# Patient Record
Sex: Male | Born: 1965 | Race: Black or African American | Hispanic: No | Marital: Single
Health system: Southern US, Community
[De-identification: ages and names within clinical notes are randomized; demographics above are authoritative.]

---

## 2009-04-24 ENCOUNTER — Inpatient Hospital Stay: Payer: Self-pay | Admitting: Internal Medicine

## 2012-04-08 ENCOUNTER — Emergency Department: Payer: Self-pay | Admitting: Unknown Physician Specialty

## 2012-08-18 ENCOUNTER — Emergency Department: Payer: Self-pay | Admitting: Emergency Medicine

## 2013-06-14 ENCOUNTER — Emergency Department: Payer: Self-pay | Admitting: Unknown Physician Specialty

## 2014-10-21 ENCOUNTER — Emergency Department: Payer: Self-pay | Admitting: Emergency Medicine

## 2017-08-12 ENCOUNTER — Emergency Department
Admission: EM | Admit: 2017-08-12 | Discharge: 2017-08-12 | Disposition: A | Discharge status: Home / Self Care | Payer: Self-pay | Attending: Emergency Medicine | Admitting: Emergency Medicine

## 2017-08-12 DIAGNOSIS — R1110 Vomiting, unspecified: Secondary | ICD-10-CM | POA: Insufficient documentation

## 2017-08-12 DIAGNOSIS — R1010 Upper abdominal pain, unspecified: Secondary | ICD-10-CM | POA: Insufficient documentation

## 2017-08-12 LAB — COMPREHENSIVE METABOLIC PANEL
ALBUMIN: 4.2 g/dL (ref 3.5–5.0)
ALT: 11 U/L — AB (ref 17–63)
AST: 18 U/L (ref 15–41)
Alkaline Phosphatase: 44 U/L (ref 38–126)
Anion gap: 6 (ref 5–15)
BUN: 15 mg/dL (ref 6–20)
CHLORIDE: 104 mmol/L (ref 101–111)
CO2: 26 mmol/L (ref 22–32)
CREATININE: 0.92 mg/dL (ref 0.61–1.24)
Calcium: 9.2 mg/dL (ref 8.9–10.3)
GFR calc non Af Amer: >60 mL/min (ref >60)
Glucose, Bld: 127 mg/dL — ABNORMAL HIGH (ref 65–99)
Potassium: 4 mmol/L (ref 3.5–5.1)
SODIUM: 136 mmol/L (ref 135–145)
Total Bilirubin: 1 mg/dL (ref 0.3–1.2)
Total Protein: 7.5 g/dL (ref 6.5–8.1)

## 2017-08-12 LAB — CBC
HCT: 40.7 % (ref 40.0–52.0)
Hemoglobin: 13.6 g/dL (ref 13.0–18.0)
MCH: 27.8 pg (ref 26.0–34.0)
MCHC: 33.5 g/dL (ref 32.0–36.0)
MCV: 83.1 fL (ref 80.0–100.0)
PLATELETS: 152 10*3/uL (ref 150–440)
RBC: 4.89 MIL/uL (ref 4.40–5.90)
RDW: 14 % (ref 11.5–14.5)
WBC: 4.4 10*3/uL (ref 3.8–10.6)

## 2017-08-12 LAB — URINALYSIS, COMPLETE (UACMP) WITH MICROSCOPIC
Bacteria, UA: NONE SEEN
Glucose, UA: NEGATIVE mg/dL
Hgb urine dipstick: NEGATIVE
KETONES UR: 5 mg/dL — AB
Nitrite: NEGATIVE
PROTEIN: 30 mg/dL — AB
Specific Gravity, Urine: 1.029 (ref 1.005–1.030)
pH: 6 (ref 5.0–8.0)

## 2017-08-12 LAB — TROPONIN I: Troponin I: <0.03 ng/mL (ref <0.03)

## 2017-08-12 LAB — LIPASE, BLOOD: LIPASE: 16 U/L (ref 11–51)

## 2017-08-12 MED ORDER — ONDANSETRON HCL 4 MG PO TABS: 4.0000 mg | ORAL_TABLET | Freq: Three times a day (TID) | ORAL | 0 refills | Status: AC | PRN

## 2017-08-12 MED ORDER — ONDANSETRON 4 MG PO TBDP
4.0000 mg | ORAL_TABLET | Freq: Once | ORAL | Status: AC
Start: 2017-08-12 — End: 2017-08-12
  Administered 2017-08-12: 4 mg via ORAL
  Filled 2017-08-12: qty 1

## 2017-08-12 NOTE — Discharge Instructions (Signed)
you were evaluated for nausea and vomiting, and although no certain cause was found, your exam and evaluation are overall reassuring in the emergency department today.  Return to the emergency department immediately for any worsening condition including chest pain, abdominal pain, trouble breathing, fever, black or bloody stool, or any other symptoms concerning to you.

## 2017-08-12 NOTE — ED Provider Notes (Addendum)
[facility] Emergency Department Provider Note ____________________________________________   I have reviewed the triage vital signs and the triage nursing note.  HISTORY  Chief Complaint Emesis   Historian Patient  HPI Jimmy Gordon is a 51 y.o. male Presents for vomiting since last night with nausea, nonbloody and nonbilious. Patient states he had severe upper abdominal cramping yesterday evening after chicken fast food. No black or bloody stools. No fever. Continued nausea overnight and a couple episodes of vomiting today. No diarrhea. He did have one normal bowel movement. Nausea is persistent and mild to moderate. He did miss work today.  Denies chest pain or trouble breathing.    History reviewed. No pertinent past medical history.  There are no active problems to display for this patient.   History reviewed. No pertinent surgical history.  Prior to Admission medications   Medication Sig Start Date End Date Taking? Authorizing Provider  ondansetron (ZOFRAN) 4 MG tablet Take 1 tablet (4 mg total) by mouth every 8 (eight) hours as needed for nausea or vomiting. 08/12/17   Governor Rooks, MD    No Known Allergies  No family history on file.  Social History Social History  Substance Use Topics  . Smoking status: Not on file  . Smokeless tobacco: Not on file  . Alcohol use Not on file    Review of Systems  Constitutional: Negative for fever. Eyes: Negative for visual changes. ENT: Negative for sore throat. Cardiovascular: Negative for chest pain. Respiratory: Negative for shortness of breath. Gastrointestinal: Negative for diarrhea. Genitourinary: Negative for dysuria. Musculoskeletal: Negative for back pain. Skin: Negative for rash. Neurological: Negative for headache.  ____________________________________________   PHYSICAL EXAM:  VITAL SIGNS: ED Triage Vitals  Enc Vitals Group     BP 08/12/17 1625 113/78     Pulse Rate  08/12/17 1625 (!) 104     Resp 08/12/17 1625 16     Temp 08/12/17 1625 99.3 F (37.4 C)     Temp Source 08/12/17 1625 Oral     SpO2 08/12/17 1625 98 %     Weight 08/12/17 1624 150 lb (68 kg)     Height 08/12/17 1624  (1.753 m)     Head Circumference --      Peak Flow --      Pain Score 08/12/17 1842 3     Pain Loc --      Pain Edu? --      Excl. in GC? --      Constitutional: Alert and oriented. Well appearing and in no distress. HEENT   Head: Normocephalic and atraumatic.      Eyes: Conjunctivae are normal. Pupils equal and round.       Ears:         Nose: No congestion/rhinnorhea.   Mouth/Throat: Mucous membranes are moist.   Neck: No stridor. Cardiovascular/Chest: Normal rate, regular rhythm.  No murmurs, rubs, or gallops. Respiratory: Normal respiratory effort without tachypnea nor retractions. Breath sounds are clear and equal bilaterally. No wheezes/rales/rhonchi. Gastrointestinal: Soft. No distention, no guarding, no rebound. Nontender to superficial and deep palpation in 4 quadrants.  Genitourinary/rectal:Deferred Musculoskeletal: Nontender with normal range of motion in all extremities. No joint effusions.  No lower extremity tenderness.  No edema. Neurologic:  Normal speech and language. No gross or focal neurologic deficits are appreciated. Skin:  Skin is warm, dry and intact. No rash noted. Psychiatric: Mood and affect are normal. Speech and behavior are normal. Patient exhibits appropriate insight and  judgment.   ____________________________________________  LABS (pertinent positives/negatives)  Labs Reviewed  COMPREHENSIVE METABOLIC PANEL - Abnormal; Notable for the following:       Result Value   Glucose, Bld 127 (*)    ALT 11 (*)    All other components within normal limits  URINALYSIS, COMPLETE (UACMP) WITH MICROSCOPIC - Abnormal; Notable for the following:    Color, Urine AMBER (*)    APPearance HAZY (*)    Bilirubin Urine SMALL (*)     Ketones, ur 5 (*)    Protein, ur 30 (*)    Leukocytes, UA MODERATE (*)    Squamous Epithelial / LPF 6-30 (*)    All other components within normal limits  URINE CULTURE  LIPASE, BLOOD  CBC  TROPONIN I    ____________________________________________    EKG I, Governor Rooksebecca Shantaya Bluestone, MD, the attending physician have personally viewed and interpreted all ECGs.  65 bpm. Normal sinus rhythm. Incomplete right bundle branch block. Normal axis. Wavy underlying interference baseline. Nonspecific ST and T-wave. ____________________________________________  RADIOLOGY All Xrays were viewed by me. Imaging interpreted by Radiologist.  none __________________________________________  PROCEDURES  Procedure(s) performed: None  Critical Care performed: None  ____________________________________________   ED COURSE / ASSESSMENT AND PLAN  Pertinent labs & imaging results that were available during my care of the patient were reviewed by me and considered in my medical decision making (see chart for details).   patient documented to have low-grade temperature and initial tachycardia, but on exam heart rate was in the 70s. No hypotension. Patient is overall well-appearing and able to take by mouth after Zofran here. Symptoms seem most consistent with upper GI virus. Seems unlikely related to cardiac etiology. EKG is nonspecific, I did add on troponin.  No abdominal pain on palpation, less likely suspect intra-abdominal emergency medical condition.  I think patient is okay for outpatient follow-up given his symptoms are basically resolved now.  Bleeding the added on troponin. Patient care transferred to Dr. Derrill KayGoodman at shift change at 8:30 PM. If troponin negative, may be discharged with my prepared Discharge instructions.     CONSULTATIONS:   None   Patient / Family / Caregiver informed of clinical course, medical decision-making process, and agree with plan.   I discussed return  precautions, follow-up instructions, and discharge instructions with patient and/or family.  Discharge Instructions :  you were evaluated for nausea and vomiting, and although no certain cause was found, your exam and evaluation are overall reassuring in the emergency department today.  Return to the emergency department immediately for any worsening condition including chest pain, abdominal pain, trouble breathing, fever, black or bloody stool, or any other symptoms concerning to you.  ___________________________________________   FINAL CLINICAL IMPRESSION(S) / ED DIAGNOSES   Final diagnoses:  Non-intractable vomiting, presence of nausea not specified, unspecified vomiting type              Note: This dictation was prepared with Dragon dictation. Any transcriptional errors that result from this process are unintentional    Governor RooksLord, Jimmy Schubring, MD 08/12/17 2032    Governor RooksLord, Jimmy Bar, MD 08/12/17 2033

## 2017-08-12 NOTE — ED Notes (Signed)
Patient given telephone to call work.

## 2017-08-12 NOTE — ED Notes (Signed)
Patient given ginger ale and ED sandwich tray per Dr. Shaune PollackLord. Patient is tolerating the meal well.

## 2017-08-12 NOTE — ED Triage Notes (Signed)
Pt came to ED via pov c/o abdominal pain and emesis starting last night. Reports vomited x6 times today.

## 2017-08-14 LAB — URINE CULTURE: CULTURE: NO GROWTH

## 2018-07-25 ENCOUNTER — Emergency Department: Payer: No Typology Code available for payment source

## 2018-07-25 ENCOUNTER — Emergency Department
Admission: EM | Admit: 2018-07-25 | Discharge: 2018-07-25 | Disposition: A | Discharge status: Home / Self Care | Payer: No Typology Code available for payment source | Attending: Emergency Medicine | Admitting: Emergency Medicine

## 2018-07-25 ENCOUNTER — Other Ambulatory Visit: Payer: Self-pay

## 2018-07-25 DIAGNOSIS — S39012A Strain of muscle, fascia and tendon of lower back, initial encounter: Secondary | ICD-10-CM

## 2018-07-25 DIAGNOSIS — Y9389 Activity, other specified: Secondary | ICD-10-CM | POA: Diagnosis not present

## 2018-07-25 DIAGNOSIS — S0990XA Unspecified injury of head, initial encounter: Secondary | ICD-10-CM | POA: Diagnosis present

## 2018-07-25 DIAGNOSIS — Y92410 Unspecified street and highway as the place of occurrence of the external cause: Secondary | ICD-10-CM | POA: Diagnosis not present

## 2018-07-25 DIAGNOSIS — Y999 Unspecified external cause status: Secondary | ICD-10-CM | POA: Diagnosis not present

## 2018-07-25 DIAGNOSIS — G44319 Acute post-traumatic headache, not intractable: Secondary | ICD-10-CM | POA: Diagnosis not present

## 2018-07-25 DIAGNOSIS — S161XXA Strain of muscle, fascia and tendon at neck level, initial encounter: Secondary | ICD-10-CM

## 2018-07-25 MED ORDER — METHOCARBAMOL 500 MG PO TABS: 500.0000 mg | ORAL_TABLET | Freq: Four times a day (QID) | ORAL | 0 refills | Status: AC

## 2018-07-25 MED ORDER — LIDOCAINE HCL (PF) 1 % IJ SOLN: 0.9000 mL | Freq: Once | INTRAMUSCULAR | Status: DC

## 2018-07-25 MED ORDER — MELOXICAM 15 MG PO TABS: 15.0000 mg | ORAL_TABLET | Freq: Every day | ORAL | 0 refills | Status: DC

## 2018-07-25 MED ORDER — METHOCARBAMOL 500 MG PO TABS
1000.0000 mg | ORAL_TABLET | Freq: Once | ORAL | Status: AC
  Administered 2018-07-25: 1000 mg via ORAL
  Filled 2018-07-25: qty 2

## 2018-07-25 MED ORDER — MELOXICAM 7.5 MG PO TABS
15.0000 mg | ORAL_TABLET | Freq: Once | ORAL | Status: AC
  Administered 2018-07-25: 15 mg via ORAL
  Filled 2018-07-25: qty 2

## 2018-07-25 MED ORDER — AZITHROMYCIN 500 MG PO TABS: 1000.0000 mg | ORAL_TABLET | Freq: Once | ORAL | Status: DC

## 2018-07-25 MED ORDER — CEFTRIAXONE SODIUM 250 MG IJ SOLR: 250.0000 mg | Freq: Once | INTRAMUSCULAR | Status: DC
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
## 2019-08-09 ENCOUNTER — Emergency Department: Payer: Self-pay

## 2019-08-09 ENCOUNTER — Emergency Department
Admission: EM | Admit: 2019-08-09 | Discharge: 2019-08-09 | Disposition: A | Discharge status: Home / Self Care | Payer: Self-pay | Attending: Emergency Medicine | Admitting: Emergency Medicine

## 2019-08-09 ENCOUNTER — Encounter: Payer: Self-pay | Admitting: Emergency Medicine

## 2019-08-09 ENCOUNTER — Other Ambulatory Visit: Payer: Self-pay

## 2019-08-09 DIAGNOSIS — R0789 Other chest pain: Secondary | ICD-10-CM | POA: Insufficient documentation

## 2019-08-09 DIAGNOSIS — M79672 Pain in left foot: Secondary | ICD-10-CM | POA: Insufficient documentation

## 2019-08-09 DIAGNOSIS — M25511 Pain in right shoulder: Secondary | ICD-10-CM | POA: Insufficient documentation

## 2019-08-09 DIAGNOSIS — Z79899 Other long term (current) drug therapy: Secondary | ICD-10-CM | POA: Insufficient documentation

## 2019-08-09 LAB — HEPATIC FUNCTION PANEL
ALT: 13 U/L (ref 0–44)
AST: 20 U/L (ref 15–41)
Albumin: 4 g/dL (ref 3.5–5.0)
Alkaline Phosphatase: 46 U/L (ref 38–126)
Bilirubin, Direct: 0.2 mg/dL (ref 0.0–0.2)
Indirect Bilirubin: 0.6 mg/dL (ref 0.3–0.9)
Total Bilirubin: 0.8 mg/dL (ref 0.3–1.2)
Total Protein: 7.2 g/dL (ref 6.5–8.1)

## 2019-08-09 LAB — CBC
HCT: 40.3 % (ref 39.0–52.0)
Hemoglobin: 13 g/dL (ref 13.0–17.0)
MCH: 26.8 pg (ref 26.0–34.0)
MCHC: 32.3 g/dL (ref 30.0–36.0)
MCV: 83.1 fL (ref 80.0–100.0)
Platelets: 188 10*3/uL (ref 150–400)
RBC: 4.85 MIL/uL (ref 4.22–5.81)
RDW: 14.3 % (ref 11.5–15.5)
WBC: 4.8 10*3/uL (ref 4.0–10.5)
nRBC: 0 % (ref 0.0–0.2)

## 2019-08-09 LAB — BASIC METABOLIC PANEL
Anion gap: 7 (ref 5–15)
BUN: 14 mg/dL (ref 6–20)
CO2: 27 mmol/L (ref 22–32)
Calcium: 9 mg/dL (ref 8.9–10.3)
Chloride: 104 mmol/L (ref 98–111)
Creatinine, Ser: 0.96 mg/dL (ref 0.61–1.24)
GFR calc Af Amer: >60 mL/min (ref >60)
GFR calc non Af Amer: >60 mL/min (ref >60)
Glucose, Bld: 88 mg/dL (ref 70–99)
Potassium: 4.3 mmol/L (ref 3.5–5.1)
Sodium: 138 mmol/L (ref 135–145)

## 2019-08-09 LAB — TROPONIN I (HIGH SENSITIVITY): Troponin I (High Sensitivity): 4 ng/L (ref <18)

## 2019-08-09 LAB — LIPASE, BLOOD: Lipase: 24 U/L (ref 11–51)

## 2019-08-09 MED ORDER — ACETAMINOPHEN 500 MG PO TABS
1000.0000 mg | ORAL_TABLET | Freq: Once | ORAL | Status: AC
  Administered 2019-08-09: 09:00:00 1000 mg via ORAL
  Filled 2019-08-09: qty 2

## 2019-08-09 MED ORDER — NAPROXEN 500 MG PO TABS: 500.0000 mg | ORAL_TABLET | Freq: Two times a day (BID) | ORAL | 0 refills | Status: AC

## 2019-08-09 MED ORDER — KETOROLAC TROMETHAMINE 30 MG/ML IJ SOLN
30.0000 mg | Freq: Once | INTRAMUSCULAR | Status: AC
  Administered 2019-08-09: 10:00:00 30 mg via INTRAVENOUS
  Filled 2019-08-09: qty 1

## 2019-08-09 NOTE — ED Provider Notes (Signed)
[facility] Emergency Department Provider Note  ____________________________________________   First MD Initiated Contact with Patient 08/09/19 [phone]     (approximate)  I have reviewed the triage vital signs and the nursing notes.   HISTORY  Chief Complaint Chest Pain    HPI PHU BLANKENBURG is a 53 y.o. male who presents with R sided chest pain.  On further discussion with patient the pain seems to be more in his right shoulder than in his chest.  The pain is mild, 3 out of 10 currently, radiates from the front of the shoulder into the back of the shoulder, intermittent, occasionally worse with certain movements, has not been taking anything to help at home.  Denies any heavy lifting or doing anything to strain the shoulder.  The pain has been there intermittently for the past 7 days.  Patient also reports pain in his left foot on the lateral side.  Denies any trauma.  Patient says this pain is been there for months. He denies sob, cough, fevers.  Denies prior heart history.     History reviewed. No pertinent past medical history.  There are no active problems to display for this patient.   History reviewed. No pertinent surgical history.  Prior to Admission medications   Medication Sig Start Date End Date Taking? Authorizing Provider  meloxicam (MOBIC) 15 MG tablet Take 1 tablet (15 mg total) by mouth daily. 07/25/18   Cuthriell, Delorise Royals, PA-C  methocarbamol (ROBAXIN) 500 MG tablet Take 1 tablet (500 mg total) by mouth 4 (four) times daily. 07/25/18   Cuthriell, Delorise Royals, PA-C  ondansetron (ZOFRAN) 4 MG tablet Take 1 tablet (4 mg total) by mouth every 8 (eight) hours as needed for nausea or vomiting. 08/12/17   Governor Rooks, MD    Allergies Patient has no known allergies.  No family history on file.  Social History Social History   Tobacco Use  . Smoking status: Not on file  Substance Use Topics  . Alcohol use: Not on file  . Drug use: Not on  file      Review of Systems Constitutional: No fever/chills Eyes: No visual changes. ENT: No sore throat. Cardiovascular: No chest pain  Respiratory: Denies shortness of breath. Gastrointestinal: No abdominal pain.  No nausea, no vomiting.  No diarrhea.  No constipation. Genitourinary: Negative for dysuria. Musculoskeletal: Negative for back pain.  Positive left foot pain, + shoulder pain  Skin: Negative for rash. Neurological: Negative for headaches, focal weakness or numbness. All other ROS negative ____________________________________________   PHYSICAL EXAM:  VITAL SIGNS: ED Triage Vitals  Enc Vitals Group     BP 08/09/19 0849 110/86     Pulse Rate 08/09/19 0849 79     Resp 08/09/19 0849 16     Temp 08/09/19 0849 97.6 F (36.4 C)     Temp Source 08/09/19 0849 Oral     SpO2 08/09/19 0849 98 %     Weight 08/09/19 0846 154 lb (69.9 kg)     Height 08/09/19 0846 5\' 9"  (1.753 m)     Head Circumference --      Peak Flow --      Pain Score 08/09/19 0846 7     Pain Loc --      Pain Edu? --      Excl. in GC? --     Constitutional: Alert and oriented. Well appearing and in no acute distress. Eyes: Conjunctivae are normal. EOMI. Head: Atraumatic. Nose: No congestion/rhinnorhea.  Mouth/Throat: Mucous membranes are moist.   Neck: No stridor. Trachea Midline. FROM Cardiovascular: Normal rate, regular rhythm. Grossly normal heart sounds.  Good peripheral circulation.  DP pulses 2+ bilaterally.  Radial pulses 2+ bilaterally Respiratory: Normal respiratory effort.  No retractions. Lungs CTAB. Gastrointestinal: Soft and nontender. No distention. No abdominal bruits.  Musculoskeletal: Tenderness on the left lateral malleolus.  No warmth or erythema over the joint able to dorsiflex and plantarflex. Thickened toe nails. No wounds noted.  2+ distal pulses.  Reproducing of some pain in the right shoulder with rotation.  No warmth or erythema over the joint.  Neurologic:  Normal speech  and language. No gross focal neurologic deficits are appreciated.  Skin:  Skin is warm, dry and intact. No rash noted. Psychiatric: Mood and affect are normal. Speech and behavior are normal. GU: Deferred   ____________________________________________   LABS (all labs ordered are listed, but only abnormal results are displayed)  Labs Reviewed  BASIC METABOLIC PANEL  CBC  HEPATIC FUNCTION PANEL  LIPASE, BLOOD  TROPONIN I (HIGH SENSITIVITY)  TROPONIN I (HIGH SENSITIVITY)   ____________________________________________   ED ECG REPORT I, Vanessa [place], the attending physician, personally viewed and interpreted this ECG.  EKG is sinus rate of 77 with sinus arrhythmia, atrial enlargement and evidence of LVH, QTC is 466 no ST elevation, no T wave inversion ____________________________________________  RADIOLOGY Robert Bellow, personally viewed and evaluated these images (plain radiographs) as part of my medical decision making, as well as reviewing the written report by the radiologist.  ED MD interpretation: Chest x-ray that evidence of pneumonia.  No fractures noted on the other x-rays  Official radiology report(s): Dg Chest 2 View  Result Date: 08/09/2019 CLINICAL DATA:  53 year old male with history of right-sided chest and shoulder pain for the past 2 months. EXAM: CHEST - 2 VIEW COMPARISON:  Chest x-ray 04/23/2009. FINDINGS: Lung volumes are normal. No consolidative airspace disease. No pleural effusions. No pneumothorax. No pulmonary nodule or mass noted. Pulmonary vasculature and the cardiomediastinal silhouette are within normal limits. IMPRESSION: No radiographic evidence of acute cardiopulmonary disease. Electronically Signed   By: Vinnie Langton M.D.   On: 08/09/2019 10:03   Dg Shoulder Right  Result Date: 08/09/2019 CLINICAL DATA:  Right shoulder pain, no known injury, initial encounter EXAM: RIGHT SHOULDER - 2+ VIEW COMPARISON:  None. FINDINGS: There is no evidence of  fracture or dislocation. There is no evidence of arthropathy or other focal bone abnormality. Soft tissues are unremarkable. IMPRESSION: No acute abnormality noted. Electronically Signed   By: Inez Catalina M.D.   On: 08/09/2019 10:01   Dg Ankle 2 Views Left  Result Date: 08/09/2019 CLINICAL DATA:  Left ankle pain, no known injury, initial encounter EXAM: LEFT ANKLE - 2 VIEW COMPARISON:  None. FINDINGS: There is no evidence of fracture, dislocation, or joint effusion. There is no evidence of arthropathy or other focal bone abnormality. Soft tissues are unremarkable. IMPRESSION: No acute abnormality noted. Electronically Signed   By: Inez Catalina M.D.   On: 08/09/2019 10:00    ____________________________________________   PROCEDURES  Procedure(s) performed (including Critical Care):  Procedures   ____________________________________________   INITIAL IMPRESSION / ASSESSMENT AND PLAN / ED COURSE   ALDEN BENSINGER was evaluated in Emergency Department on 08/09/2019 for the symptoms described in the history of present illness. He was evaluated in the context of the global COVID-19 pandemic, which necessitated consideration that the patient might be at risk for infection with  the SARS-CoV-2 virus that causes COVID-19. Institutional protocols and algorithms that pertain to the evaluation of patients at risk for COVID-19 are in a state of rapid change based on information released by regulatory bodies including the CDC and federal and state organizations. These policies and algorithms were followed during the patient's care in the ED.    Most Likely DDx:  -Given pain is more so situated on the right shoulder than in his chest is most likely musculoskeletal in nature.  Patient says it is also worsened with certain movements.  However given patient's age risk factors will get cardiac markers to evaluate for ACS.  Given pain >3 hours can rule out with one trop if negative. Patient also noted to have left  ankle tenderness.  No redness or swelling of the ankle to suggest septic joint.  Full range of motion of joint although there is tenderness of the lateral malleolus.  Will get an x-ray to further evaluate but given this pain is been going on for 2 months most likely musculoskeletal. Pt also ambulated into the room without difficulty.   DDx that was also considered d/t potential to cause harm, but was found less likely based on history and physical (as detailed above): -PNA (no fevers, cough but CXR to evaluate) -PNX (reassured with equal b/l breath sounds, CXR to evaluate) -Symptomatic anemia (will get H&H) -Pulmonary embolism as no sob at rest, not pleuritic in nature, no hypoxia -Aortic Dissection as no tearing pain and no radiation to the mid back, pulses equal -Pericarditis no rub on exam, EKG changes or hx to suggest dx -Tamponade (no notable SOB, tachycardic, hypotensive) -Esophageal rupture (no h/o diffuse vomitting/no crepitus)   Labs are reassuring with no evidence of white count making infection less likely.  Hemoglobin is at baseline.  Cardiac marker is 4 given this pain is been going on for the past few days rules out for ACS.  X-rays are negative.  10:19 AM updated patient on results.  Discussed naproxen and Tylenol for symptom management.  Will give patient a podiatry follow-up for his thickened nails. Provided ace bandage.   I discussed the provisional nature of ED diagnosis, the treatment so far, the ongoing plan of care, follow up appointments and return precautions with the patient and any family or support people present. They expressed understanding and agreed with the plan, discharged home.    ____________________________________________   FINAL CLINICAL IMPRESSION(S) / ED DIAGNOSES   Final diagnoses:  Acute pain of right shoulder  Foot pain, left     MEDICATIONS GIVEN DURING THIS VISIT:  Medications  ketorolac (TORADOL) 30 MG/ML injection 30 mg (has no  administration in time range)  acetaminophen (TYLENOL) tablet 1,000 mg (1,000 mg Oral Given 08/09/19 0904)     ED Discharge Orders         Ordered    naproxen (NAPROSYN) 500 MG tablet  2 times daily with meals     08/09/19 1026           Note:  This document was prepared using Dragon voice recognition software and may include unintentional dictation errors.   Concha SeFunke, Freda Jaquith E, MD 08/09/19 1027

## 2019-08-09 NOTE — ED Notes (Signed)
This RN discussed with Dr. Jari Pigg regarding patient request for crutches prior to discharge, initially upon arrival to room pt ambulatory without difficulty or limp. Per Dr. Jari Pigg pt does not need crutches if ambulatory without difficulty. Reported to this RN by Chrissy RN that patient was angry regarding not being given crutches. Chrissy, RN reported that she explained to patient that he did not need crutches per MD and given that pain was ongoing x 2 months. Pt visualized limping with limp not previously visualized by this RN down the hall to discharge.

## 2019-08-09 NOTE — Discharge Instructions (Addendum)
Your work-up was reassuring including negative x-rays and normal labs.  Take tylenol 1g three times daily and naproxen to help with pain.   I have given you podiatry's number to follow-up with.  You should also check with your insurance and find a primary care doctor to make an appointment.  Return to the ER for chest pain, shortness of breath, fevers or any other concerns

## 2019-08-09 NOTE — ED Notes (Signed)
Patient transported to X-ray 

## 2019-08-09 NOTE — ED Triage Notes (Signed)
Pt arrives with complaints of right sided chest tightness that radiates to his back. Pt reports the pain started 5 days prior and is worse with movement.

## 2019-08-09 NOTE — ED Notes (Signed)
Cap refill <3 seconds before application. Ace bandaged applied to right ankle. Cap refill <3 seconds after application. Educated pt on applying ace wrap at home.

## 2020-06-11 ENCOUNTER — Ambulatory Visit: Payer: No Typology Code available for payment source | Attending: Internal Medicine

## 2020-07-20 ENCOUNTER — Ambulatory Visit: Payer: No Typology Code available for payment source | Attending: Internal Medicine

## 2020-07-20 DIAGNOSIS — Z23 Encounter for immunization: Secondary | ICD-10-CM

## 2020-07-20 NOTE — Progress Notes (Signed)
   Covid-19 Vaccination Clinic  Name:  Jimmy Gordon    MRN: [id number] DOB: 11/11/1966  07/20/2020  Mr. [name] was observed post Covid-19 immunization for 15 minutes without incident. He was provided with Vaccine Information Sheet and instruction to access the V-Safe system.   Mr. [name] was instructed to call 911 with any severe reactions post vaccine: Marland Kitchen Difficulty breathing  . Swelling of face and throat  . A fast heartbeat  . A bad rash all over body  . Dizziness and weakness   Immunizations Administered    Name Date Dose VIS Date Route   Moderna COVID-19 Vaccine 07/20/2020  4:01 PM 0.5 mL 11/2019 Intramuscular   Manufacturer: Moderna   Lot: [id number]   NDC: 38887-579-72

## 2020-08-17 ENCOUNTER — Ambulatory Visit: Payer: Self-pay

## 2021-04-28 IMAGING — CR DG CHEST 2V
2 series · 2 of 2 positions shown · non-contrast
Comparison: Chest x-ray 04/23/2009.

CLINICAL DATA: 52-year-old male with history of right-sided chest
and shoulder pain for the past 2 months.

EXAM:
CHEST - 2 VIEW

[chest pa]
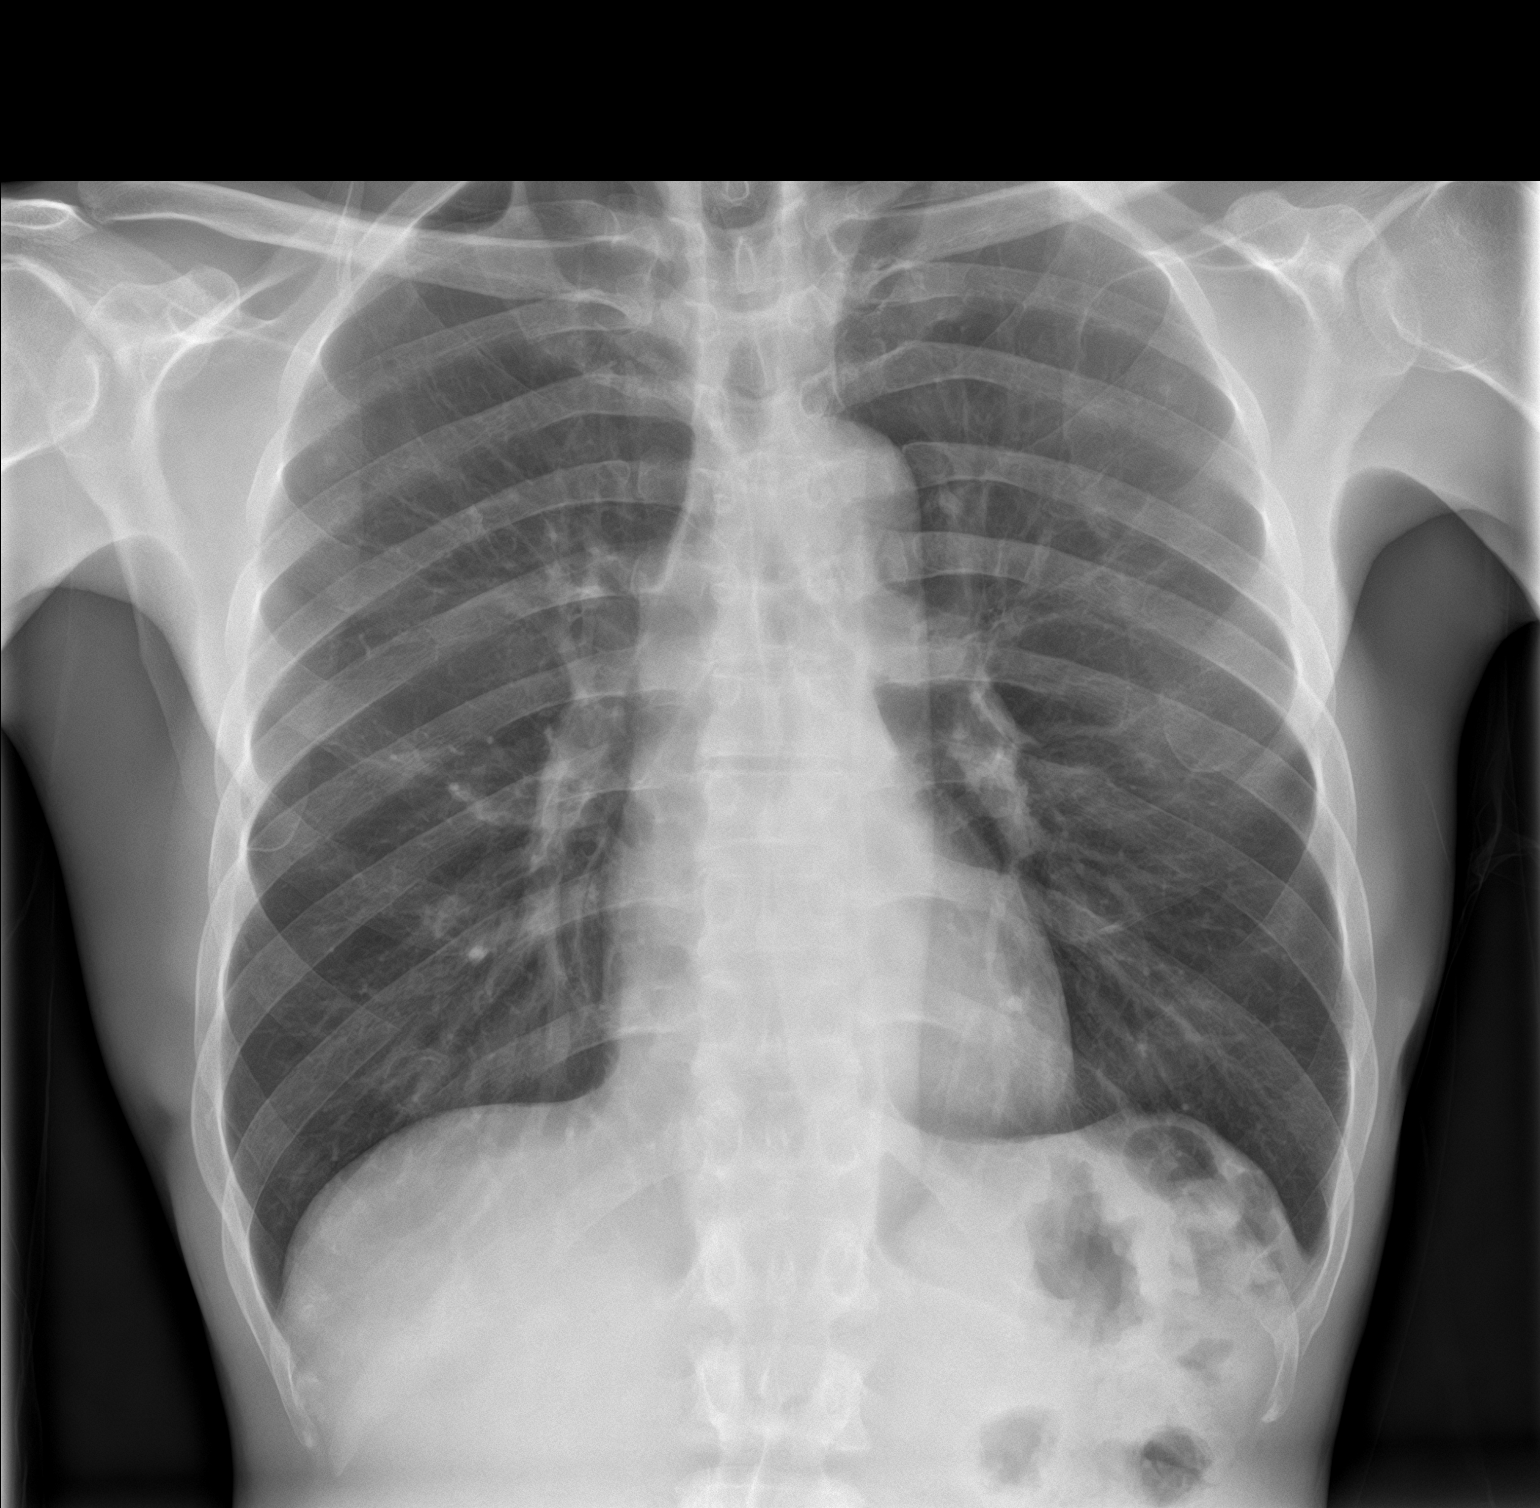

[chest lat]
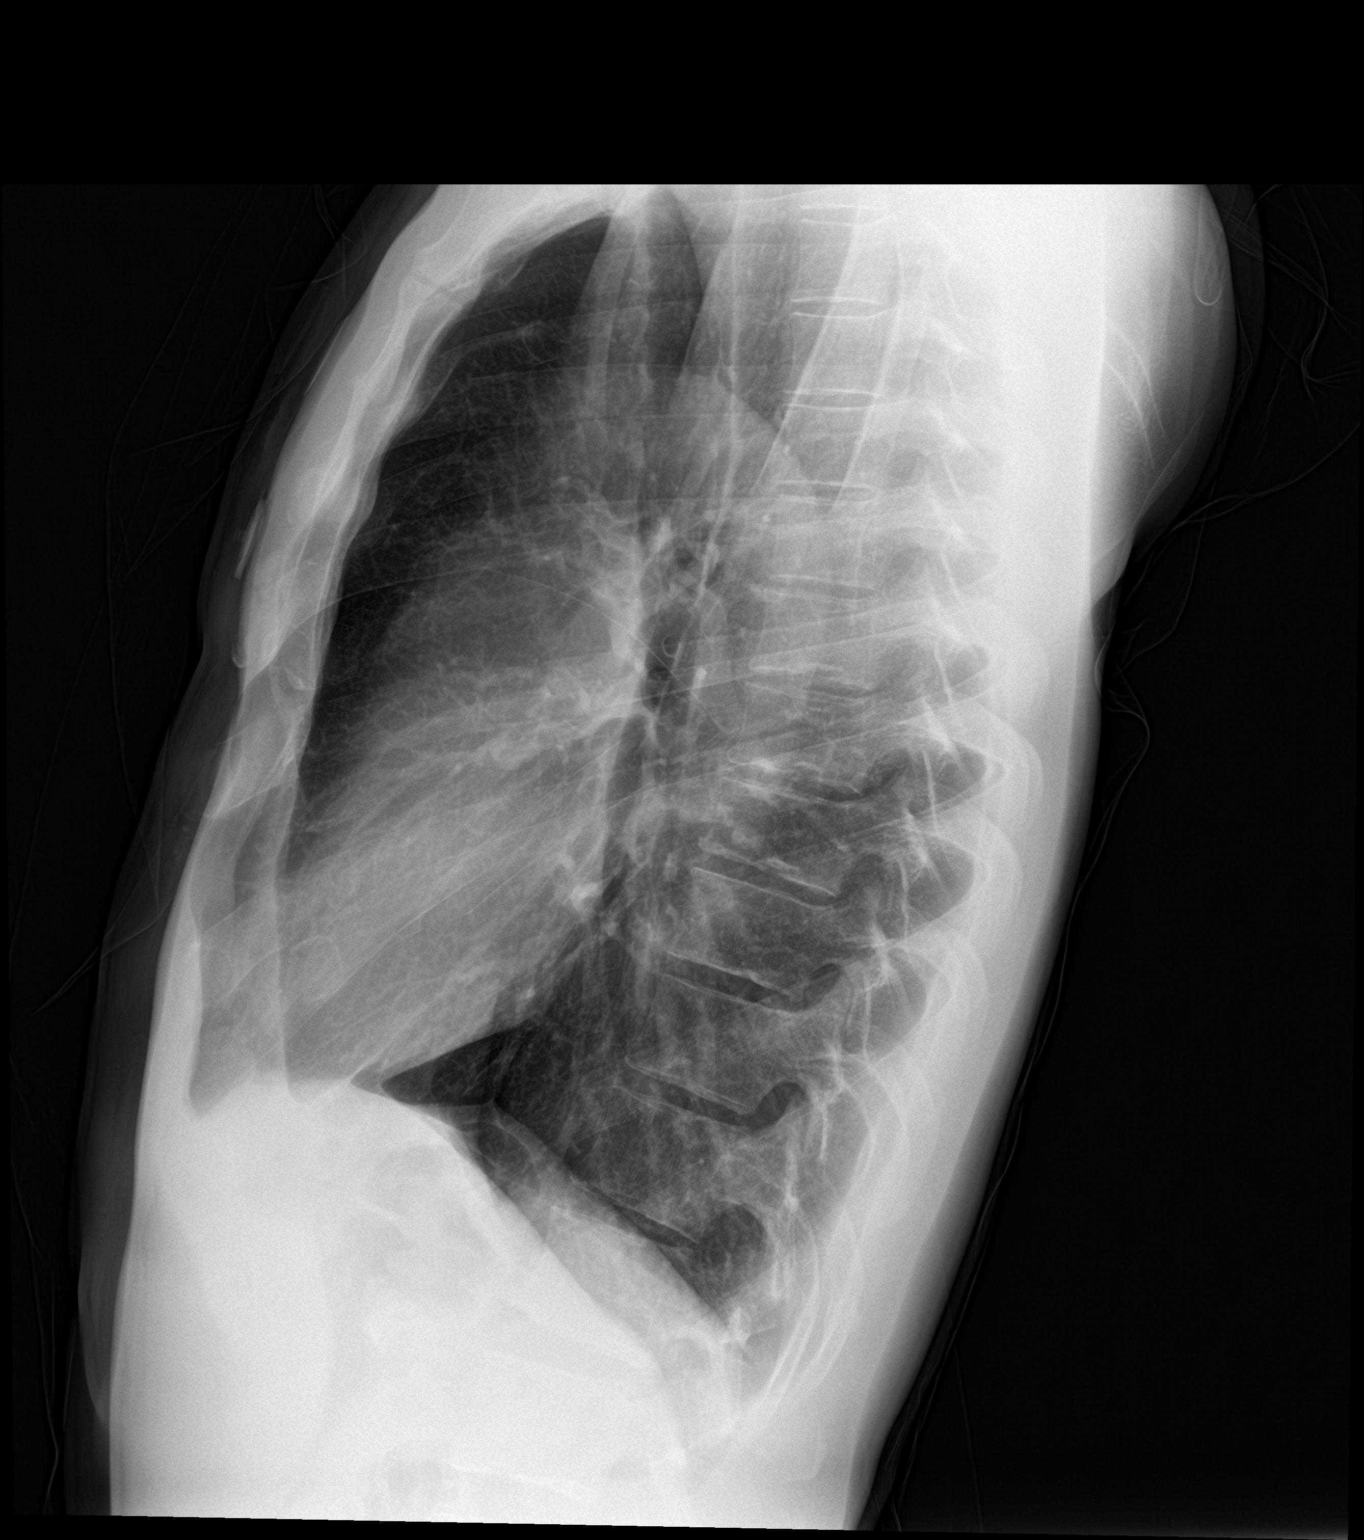

[2 of 2 positions shown; findings below may reference images not displayed]

FINDINGS: Lung volumes are normal. No consolidative airspace disease. No
pleural effusions. No pneumothorax. No pulmonary nodule or mass
noted. Pulmonary vasculature and the cardiomediastinal silhouette
are within normal limits.
IMPRESSION: No radiographic evidence of acute cardiopulmonary disease.

## 2024-04-24 ENCOUNTER — Ambulatory Visit: Payer: Self-pay
